# Patient Record
Sex: Female | Born: 1990 | Race: White | Hispanic: No | Marital: Single | Smoking: Never smoker
Health system: Southern US, Community
[De-identification: ages and names within clinical notes are randomized; demographics above are authoritative.]

## PROBLEM LIST (undated history)

## (undated) DIAGNOSIS — R569 Unspecified convulsions: Secondary | ICD-10-CM

## (undated) DIAGNOSIS — F419 Anxiety disorder, unspecified: Secondary | ICD-10-CM

## (undated) DIAGNOSIS — F32A Depression, unspecified: Secondary | ICD-10-CM

## (undated) DIAGNOSIS — R51 Headache: Secondary | ICD-10-CM

## (undated) DIAGNOSIS — G40909 Epilepsy, unspecified, not intractable, without status epilepticus: Secondary | ICD-10-CM

## (undated) DIAGNOSIS — G43909 Migraine, unspecified, not intractable, without status migrainosus: Secondary | ICD-10-CM

## (undated) DIAGNOSIS — K589 Irritable bowel syndrome without diarrhea: Secondary | ICD-10-CM

## (undated) DIAGNOSIS — F329 Major depressive disorder, single episode, unspecified: Secondary | ICD-10-CM

## (undated) HISTORY — DX: Headache: R51

## (undated) HISTORY — DX: Epilepsy, unspecified, not intractable, without status epilepticus: G40.909

## (undated) HISTORY — PX: OTHER SURGICAL HISTORY: SHX169

## (undated) HISTORY — DX: Migraine, unspecified, not intractable, without status migrainosus: G43.909

## (undated) HISTORY — DX: Unspecified convulsions: R56.9

## (undated) HISTORY — DX: Depression, unspecified: F32.A

## (undated) HISTORY — DX: Major depressive disorder, single episode, unspecified: F32.9

## (undated) HISTORY — PX: TONSILLECTOMY: SUR1361

## (undated) HISTORY — DX: Anxiety disorder, unspecified: F41.9

---
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
## 2012-02-11 ENCOUNTER — Encounter (HOSPITAL_COMMUNITY): Payer: Self-pay | Admitting: Psychiatry

## 2012-02-11 ENCOUNTER — Ambulatory Visit (INDEPENDENT_AMBULATORY_CARE_PROVIDER_SITE_OTHER): Payer: BC Managed Care – PPO | Admitting: Psychiatry

## 2012-02-11 VITALS — BP 110/68 | Ht 62.0 in | Wt 151.6 lb

## 2012-02-11 DIAGNOSIS — F4001 Agoraphobia with panic disorder: Secondary | ICD-10-CM | POA: Insufficient documentation

## 2012-02-11 MED ORDER — FLUOXETINE HCL 10 MG PO CAPS: ORAL_CAPSULE | ORAL | Status: DC

## 2012-02-11 NOTE — Progress Notes (Signed)
[facility] [address] Progress Note  Diane Ford [id number] 21 y.o.  02/11/2012 10:04 AM  Chief Complaint: I have dropped out of school, I'm struggling with anxiety and I stay mostly to myself  History of Present Illness: Patient is a 21 year old diagnosed with panic disorder with agoraphobia, depressive disorder NOS who presents today for a followup visit. Patient was seen once in August of 2012 and has not been back since then. Mom adds to the primary care physician has been prescribing her Zoloft, she's been off the clonazepam for many months now but as patient continues to struggle with anxiety, they decided that they needed the patient's medications adjusted or changed.  Patient says that she's not going to school, stays mostly to her self, gets really anxious if she has to go in public as she is afraid she could have a seizure. She last had a seizure in January but still has a lot of anxiety about having them in public. She also complains of headaches on a daily basis as she worries about her future, refuses to learn how to drive as she states she will have a seizure and end up killing someone. Mom plans to get her enrolled in a [place]. They both deny any safety concerns, any side effects of the medication. The patient also denies self mutilating behaviors. She adds that she needs to restart seeing her therapist again as she wants her anxiety under control, wants to be able to live a regular life.  Suicidal Ideation: No Plan Formed: No Patient has means to carry out plan: No  Homicidal Ideation: No Plan Formed: No Patient has means to carry out plan: No  Review of Systems: Psychiatric: Agitation: No Hallucination: No Depressed Mood: Yes Insomnia: No Hypersomnia: No Altered Concentration: Yes Feels Worthless: Yes Grandiose Ideas: No Belief In Special Powers: No New/Increased Substance Abuse: No Compulsions: No  Neurologic: Headache: Yes Seizure: Yes has a history of  epilepsy, last seizure was in January of 2013 as per patient Paresthesias: No  Past Medical Family, Social History: Has dropped out of high school  Outpatient Encounter Prescriptions as of 02/11/2012  Medication Sig Dispense Refill  . AZURETTE 0.15-0.02/0.01 MG (21/5) tablet       . FLUoxetine (PROZAC) 10 MG capsule Po 1 QAM for 10 days and then 2 QPM  60 capsule  1  . levETIRAcetam (KEPPRA) 500 MG tablet       . DISCONTD: clonazePAM (KLONOPIN) 0.5 MG tablet Take 0.25 mg by mouth once.        Marland Kitchen DISCONTD: sertraline (ZOLOFT) 50 MG tablet Take 50 mg by mouth daily.          Past Psychiatric History/Hospitalization(s): Anxiety: Yes Bipolar Disorder: No Depression: Yes Mania: No Psychosis: No Schizophrenia: No Personality Disorder: No Hospitalization for psychiatric illness: No History of Electroconvulsive Shock Therapy: No Prior Suicide Attempts: No  Physical Exam: Constitutional:  BP 110/68  Ht 5\' 2"  (1.575 m)  Wt 151 lb 9.6 oz (68.765 kg)  BMI 27.73 kg/m2  General Appearance: alert, oriented, no acute distress but appears sad and has constricted affect  Musculoskeletal: Strength & Muscle Tone: within normal limits Gait & Station: normal Patient leans: N/A  Psychiatric: Speech (describe rate, volume, coherence, spontaneity, and abnormalities if any): Normal in volume, rate, tone, spontaneous   Thought Process (describe rate, content, abstract reasoning, and computation): Organized, goal directed, age appropriate   Associations: Relevant and Intact  Thoughts: obsessions  Mental Status: Orientation: oriented to person,  place and situation Mood & Affect: depressed affect and anxiety Attention Span & Concentration: so,so  Medical Decision Making (Choose Three): Established Problem, Worsening (2), New Problem, with no additional work-up planned (3), Review of Last Therapy Session (1), Review of Medication Regimen & Side Effects (2), Review of New Medication or Change in  Dosage (2) and Review or Order of Psychological Test(s) (1)  Assessment: Axis I: Panic disorder with agoraphobia, depressive disorder NOS  Axis II: Deferred  Axis III: Status post tonsillectomy, migraines, generalized seizure disorder  Axis IV: Educational issues, social issues, health issues  Axis V: 55-60   Plan: Discontinue Zoloft 50 mg and to start Prozac 10 mg one in the morning for 10 days and then increase to 20 mg in the morning. This and benefits along with the side effects were discussed with mom and the patient and they were agreeable with this plan To restart seeing Peggy for therapy here at the office to help with anxiety and mood Discussed with mom the need for patient to file for disability Continue Keppra 500 mg twice daily for seizure disorder and to followup regularly with her neurologist in regards to his seizures and headaches Discussed regular exercise to help with anxiety in length at this visit including meditation Call when necessary Followup in 4 weeks  Nelly Rout, MD 02/11/2012

## 2012-02-25 ENCOUNTER — Ambulatory Visit (HOSPITAL_COMMUNITY): Payer: Self-pay | Admitting: Psychiatry

## 2012-03-01 ENCOUNTER — Emergency Department (HOSPITAL_COMMUNITY): Payer: BC Managed Care – PPO

## 2012-03-01 ENCOUNTER — Encounter (HOSPITAL_COMMUNITY): Payer: Self-pay | Admitting: *Deleted

## 2012-03-01 ENCOUNTER — Emergency Department (HOSPITAL_COMMUNITY)
Admission: EM | Admit: 2012-03-01 | Discharge: 2012-03-01 | Disposition: A | Discharge status: Home / Self Care | Payer: BC Managed Care – PPO | Attending: Emergency Medicine | Admitting: Emergency Medicine

## 2012-03-01 DIAGNOSIS — R51 Headache: Secondary | ICD-10-CM | POA: Insufficient documentation

## 2012-03-01 DIAGNOSIS — R10819 Abdominal tenderness, unspecified site: Secondary | ICD-10-CM | POA: Insufficient documentation

## 2012-03-01 DIAGNOSIS — M549 Dorsalgia, unspecified: Secondary | ICD-10-CM | POA: Insufficient documentation

## 2012-03-01 DIAGNOSIS — R109 Unspecified abdominal pain: Secondary | ICD-10-CM | POA: Insufficient documentation

## 2012-03-01 LAB — CBC
HCT: 38 % (ref 36.0–46.0)
Hemoglobin: 13.2 g/dL (ref 12.0–15.0)
MCHC: 34.7 g/dL (ref 30.0–36.0)
RBC: 4.52 MIL/uL (ref 3.87–5.11)

## 2012-03-01 LAB — DIFFERENTIAL
Basophils Relative: 0 % (ref 0–1)
Lymphs Abs: 2 10*3/uL (ref 0.7–4.0)
Monocytes Absolute: 0.4 10*3/uL (ref 0.1–1.0)
Monocytes Relative: 5 % (ref 3–12)
Neutro Abs: 5.1 10*3/uL (ref 1.7–7.7)
Neutrophils Relative %: 67 % (ref 43–77)

## 2012-03-01 LAB — COMPREHENSIVE METABOLIC PANEL
Albumin: 3.6 g/dL (ref 3.5–5.2)
Alkaline Phosphatase: 66 U/L (ref 39–117)
BUN: 9 mg/dL (ref 6–23)
CO2: 24 mEq/L (ref 19–32)
Chloride: 101 mEq/L (ref 96–112)
Creatinine, Ser: 0.71 mg/dL (ref 0.50–1.10)
GFR calc Af Amer: >90 mL/min (ref >90)
GFR calc non Af Amer: >90 mL/min (ref >90)
Glucose, Bld: 81 mg/dL (ref 70–99)
Potassium: 3.5 mEq/L (ref 3.5–5.1)
Total Bilirubin: 0.4 mg/dL (ref 0.3–1.2)

## 2012-03-01 LAB — URINALYSIS, ROUTINE W REFLEX MICROSCOPIC
Bilirubin Urine: NEGATIVE
Hgb urine dipstick: NEGATIVE
Ketones, ur: NEGATIVE mg/dL
Nitrite: NEGATIVE
Specific Gravity, Urine: 1.02 (ref 1.005–1.030)
Urobilinogen, UA: 0.2 mg/dL (ref 0.0–1.0)

## 2012-03-01 LAB — PREGNANCY, URINE: Preg Test, Ur: NEGATIVE

## 2012-03-01 LAB — LIPASE, BLOOD: Lipase: 46 U/L (ref 11–59)

## 2012-03-01 MED ORDER — SODIUM CHLORIDE 0.9 % IV BOLUS (SEPSIS)
250.0000 mL | Freq: Once | INTRAVENOUS | Status: AC
  Administered 2012-03-01: 250 mL via INTRAVENOUS

## 2012-03-01 MED ORDER — HYDROMORPHONE HCL PF 1 MG/ML IJ SOLN
1.0000 mg | Freq: Once | INTRAMUSCULAR | Status: AC
  Administered 2012-03-01: 1 mg via INTRAVENOUS
  Filled 2012-03-01: qty 1

## 2012-03-01 MED ORDER — IOHEXOL 300 MG/ML  SOLN
100.0000 mL | Freq: Once | INTRAMUSCULAR | Status: AC | PRN
  Administered 2012-03-01: 100 mL via INTRAVENOUS

## 2012-03-01 MED ORDER — SODIUM CHLORIDE 0.9 % IV SOLN
INTRAVENOUS | Status: DC
  Administered 2012-03-01: 19:00:00 via INTRAVENOUS

## 2012-03-01 MED ORDER — HYDROCODONE-ACETAMINOPHEN 5-325 MG PO TABS: 1.0000 | ORAL_TABLET | Freq: Four times a day (QID) | ORAL | Status: AC | PRN

## 2012-03-01 MED ORDER — ONDANSETRON HCL 4 MG/2ML IJ SOLN
4.0000 mg | Freq: Once | INTRAMUSCULAR | Status: AC
  Administered 2012-03-01: 4 mg via INTRAVENOUS
  Filled 2012-03-01: qty 2

## 2012-03-01 NOTE — ED Provider Notes (Addendum)
History     CSN: [id number]  Arrival date & time 03/01/12  1656   First MD Initiated Contact with Patient 03/01/12 1817      Chief Complaint  Patient presents with  . Abdominal Pain    (Consider location/radiation/quality/duration/timing/severity/associated sxs/prior treatment) Patient is a 21 y.o. female presenting with abdominal pain. The history is provided by the patient.  Abdominal Pain The primary symptoms of the illness include abdominal pain. The primary symptoms of the illness do not include fever, shortness of breath, nausea, vomiting, diarrhea, hematochezia, dysuria or vaginal discharge. The current episode started more than 2 days ago. The onset of the illness was gradual. The problem has been rapidly worsening.  The patient states that she believes she is currently not pregnant. The patient has not had a change in bowel habit. Additional symptoms associated with the illness include anorexia and back pain. Symptoms associated with the illness do not include chills, diaphoresis, constipation, hematuria or frequency.    Past Medical History  Diagnosis Date  . Seizures   . Headache     Past Surgical History  Procedure Date  . Tonsillectomy     Family History  Problem Relation Age of Onset  . OCD Father     History  Substance Use Topics  . Smoking status: Never Smoker   . Smokeless tobacco: Not on file  . Alcohol Use: No    OB History    Grav Para Term Preterm Abortions TAB SAB Ect Mult Living                  Review of Systems  Constitutional: Negative for fever, chills and diaphoresis.  HENT: Negative for congestion and neck pain.   Eyes: Negative for visual disturbance.  Respiratory: Negative for cough and shortness of breath.   Gastrointestinal: Positive for abdominal pain and anorexia. Negative for nausea, vomiting, diarrhea, constipation and hematochezia.  Genitourinary: Negative for dysuria, frequency, hematuria and vaginal discharge.    Musculoskeletal: Positive for back pain.  Skin: Negative for rash.  Neurological: Positive for headaches.  Hematological: Does not bruise/bleed easily.    Allergies  Review of patient's allergies indicates no known allergies.  Home Medications   Current Outpatient Rx  Name Route Sig Dispense Refill  . AZURETTE 0.15-0.02/0.01 MG (21/5) PO TABS Oral Take 1 tablet by mouth daily.     Marland Kitchen FLUOXETINE HCL 10 MG PO CAPS Oral Take 20 mg by mouth daily.    . IBUPROFEN 200 MG PO TABS Oral Take 800 mg by mouth as needed. For headache pain    . LEVETIRACETAM 500 MG PO TABS Oral Take 500 mg by mouth 2 (two) times daily.     Marland Kitchen HYDROCODONE-ACETAMINOPHEN 5-325 MG PO TABS Oral Take 1-2 tablets by mouth every 6 (six) hours as needed for pain. 10 tablet 0    BP 118/81  Pulse 83  Temp 98.1 F (36.7 C) (Oral)  Resp 18  Ht 5\' 2"  (1.575 m)  Wt 150 lb (68.04 kg)  BMI 27.44 kg/m2  SpO2 100%  LMP 02/08/2012  Physical Exam  Nursing note and vitals reviewed. Constitutional: She is oriented to person, place, and time. She appears well-developed and well-nourished. No distress.  HENT:  Head: Normocephalic and atraumatic.  Mouth/Throat: Oropharynx is clear and moist.  Eyes: Conjunctivae and EOM are normal. Pupils are equal, round, and reactive to light.  Neck: Normal range of motion. Neck supple.  Cardiovascular: Normal rate, regular rhythm and normal heart sounds.  No murmur heard. Pulmonary/Chest: Effort normal and breath sounds normal.  Abdominal: Soft. Bowel sounds are normal. There is tenderness.       Tender right-sided abdomen right lower quadrant greater than right upper quadrant.  Musculoskeletal: Normal range of motion. She exhibits no edema and no tenderness.  Neurological: She is alert and oriented to person, place, and time. No cranial nerve deficit. She exhibits normal muscle tone. Coordination normal.  Skin: Skin is warm. No rash noted.    ED Course  Procedures (including critical  care time)   Labs Reviewed  URINALYSIS, ROUTINE W REFLEX MICROSCOPIC  PREGNANCY, URINE  CBC  DIFFERENTIAL  COMPREHENSIVE METABOLIC PANEL  LIPASE, BLOOD   Ct Abdomen Pelvis W Contrast  03/01/2012  *RADIOLOGY REPORT*  Clinical Data: Left-sided abdominal pain.  CT ABDOMEN AND PELVIS WITH CONTRAST  Technique:  Multidetector CT imaging of the abdomen and pelvis was performed following the standard protocol during bolus administration of intravenous contrast.  Contrast: OMNIPAQUE IOHEXOL 300 MG/ML  SOLN  Comparison: 06/26/2010  Findings: Stable linear scarring in the right middle lobe.  Lung bases otherwise clear.  No pleural effusions.  Liver, gallbladder, spleen, pancreas, adrenals and kidneys are unremarkable.  Uterus, adnexa and urinary bladder unremarkable.  Appendix is visualized and is normal.  There are mildly prominent right lower quadrant and central mesenteric lymph nodes, stable since prior study.  Large and small bowel are unremarkable.  No free fluid, free air or adenopathy. No acute bony abnormality.  IMPRESSION: Normal appendix.  No acute findings.  Original Report Authenticated By: Cyndie Chime, M.D.   Results for orders placed during the hospital encounter of 03/01/12  URINALYSIS, ROUTINE W REFLEX MICROSCOPIC      Component Value Range   Color, Urine YELLOW  YELLOW   APPearance CLEAR  CLEAR   Specific Gravity, Urine 1.020  1.005 - 1.030   pH 6.5  5.0 - 8.0   Glucose, UA NEGATIVE  NEGATIVE mg/dL   Hgb urine dipstick NEGATIVE  NEGATIVE   Bilirubin Urine NEGATIVE  NEGATIVE   Ketones, ur NEGATIVE  NEGATIVE mg/dL   Protein, ur NEGATIVE  NEGATIVE mg/dL   Urobilinogen, UA 0.2  0.0 - 1.0 mg/dL   Nitrite NEGATIVE  NEGATIVE   Leukocytes, UA NEGATIVE  NEGATIVE  PREGNANCY, URINE      Component Value Range   Preg Test, Ur NEGATIVE  NEGATIVE  CBC      Component Value Range   WBC 7.6  4.0 - 10.5 K/uL   RBC 4.52  3.87 - 5.11 MIL/uL   Hemoglobin 13.2  12.0 - 15.0 g/dL   HCT  69.6  29.5 - 28.4 %   MCV 84.1  78.0 - 100.0 fL   MCH 29.2  26.0 - 34.0 pg   MCHC 34.7  30.0 - 36.0 g/dL   RDW 13.2  44.0 - 10.2 %   Platelets 284  150 - 400 K/uL  DIFFERENTIAL      Component Value Range   Neutrophils Relative 67  43 - 77 %   Neutro Abs 5.1  1.7 - 7.7 K/uL   Lymphocytes Relative 27  12 - 46 %   Lymphs Abs 2.0  0.7 - 4.0 K/uL   Monocytes Relative 5  3 - 12 %   Monocytes Absolute 0.4  0.1 - 1.0 K/uL   Eosinophils Relative 1  0 - 5 %   Eosinophils Absolute 0.1  0.0 - 0.7 K/uL   Basophils Relative 0  0 -  1 %   Basophils Absolute 0.0  0.0 - 0.1 K/uL  COMPREHENSIVE METABOLIC PANEL      Component Value Range   Sodium 136  135 - 145 mEq/L   Potassium 3.5  3.5 - 5.1 mEq/L   Chloride 101  96 - 112 mEq/L   CO2 24  19 - 32 mEq/L   Glucose, Bld 81  70 - 99 mg/dL   BUN 9  6 - 23 mg/dL   Creatinine, Ser 4.54  0.50 - 1.10 mg/dL   Calcium 9.5  8.4 - 09.8 mg/dL   Total Protein 7.1  6.0 - 8.3 g/dL   Albumin 3.6  3.5 - 5.2 g/dL   AST 13  0 - 37 U/L   ALT 8  0 - 35 U/L   Alkaline Phosphatase 66  39 - 117 U/L   Total Bilirubin 0.4  0.3 - 1.2 mg/dL   GFR calc non Af Amer >90  >90 mL/min   GFR calc Af Amer >90  >90 mL/min  LIPASE, BLOOD      Component Value Range   Lipase 46  11 - 59 U/L     1. Abdominal pain       MDM   Patient with right-sided abdominal pain for one to 2 weeks but worse in the past day. Similar symptoms a few years ago workup without any specific findings. Patient states most of the pain is right lower corner and some his right upper quadrant past surgical history his only sniffing for tonsillectomy. Patient's labs are normal including her liver function tests and lipase urinalysis is normal and pregnancy test is negative patient will be transferred via [facility] for CT scan since ours is down and then will return with the results.   Patient CT scan without any sniffing abnormalities labs without any significant abnormalities  to include pregnancy test being negative urinalysis being negative no leukocytosis LFTs are normal lipase is normal we'll discharge patient home with pain medicine and followup with her doctor in [place].     Shelda Jakes, MD 03/01/12 2011  Shelda Jakes, MD 03/01/12 2135

## 2012-03-01 NOTE — Discharge Instructions (Signed)
CT scan lab workup is negative for any significant problems in the abdomen followup with your regular Dr. In the next few days take pain medicine as directed. Return for new or worse symptoms.

## 2012-03-01 NOTE — ED Notes (Signed)
Pain rt side of body , headache, abd and back pain.Marland Kitchen  No NVD.

## 2012-03-10 ENCOUNTER — Ambulatory Visit (HOSPITAL_COMMUNITY): Payer: Self-pay | Admitting: Psychiatry

## 2012-03-11 ENCOUNTER — Ambulatory Visit (INDEPENDENT_AMBULATORY_CARE_PROVIDER_SITE_OTHER): Payer: BC Managed Care – PPO | Admitting: Internal Medicine

## 2012-03-11 ENCOUNTER — Encounter (INDEPENDENT_AMBULATORY_CARE_PROVIDER_SITE_OTHER): Payer: Self-pay | Admitting: Internal Medicine

## 2012-03-11 VITALS — BP 106/72 | HR 80 | Temp 98.4°F | Ht 62.0 in | Wt 149.2 lb

## 2012-03-11 DIAGNOSIS — R52 Pain, unspecified: Secondary | ICD-10-CM

## 2012-03-11 DIAGNOSIS — G40909 Epilepsy, unspecified, not intractable, without status epilepticus: Secondary | ICD-10-CM

## 2012-03-11 DIAGNOSIS — K589 Irritable bowel syndrome without diarrhea: Secondary | ICD-10-CM

## 2012-03-11 DIAGNOSIS — G43909 Migraine, unspecified, not intractable, without status migrainosus: Secondary | ICD-10-CM

## 2012-03-11 DIAGNOSIS — R1033 Periumbilical pain: Secondary | ICD-10-CM

## 2012-03-11 MED ORDER — HYOSCYAMINE SULFATE 0.125 MG SL SUBL: 0.1250 mg | SUBLINGUAL_TABLET | SUBLINGUAL | Status: DC | PRN

## 2012-03-11 NOTE — Patient Instructions (Addendum)
Levsin every 4 hrs as needed. CRP today. PR in 2 weeks.

## 2012-03-11 NOTE — Progress Notes (Addendum)
Subjective:     Patient ID: Diane Ford, female   DOB: 1990-12-13, 21 y.o.   MRN: [id number]  HPI Seen in the ED 03/01/2012 for mid abdominal pain radiating into sides. She says she is still having the pain. Her mother says she has had this pain for years. She says she has the pain after she eats. She has not tried any medication for this. She occasionally has acid reflux.  She says even with light touch she has pain. Appetite is okay. No weight loss. BM x 1 a day then she have one every 3 days.  She sometimes will get diarrhea with the anxiety.   She says she had rectal bleeding 3 days ago. Noticed in the toilet.  03/01/2012 CT abdomen/pelvis with CM;IMPRESSION:  Normal appendix.  No acute findings.    CBC    Component Value Date/Time   WBC 7.6 03/01/2012 1920   RBC 4.52 03/01/2012 1920   HGB 13.2 03/01/2012 1920   HCT 38.0 03/01/2012 1920   PLT 284 03/01/2012 1920   MCV 84.1 03/01/2012 1920   MCH 29.2 03/01/2012 1920   MCHC 34.7 03/01/2012 1920   RDW 12.7 03/01/2012 1920   LYMPHSABS 2.0 03/01/2012 1920   MONOABS 0.4 03/01/2012 1920   EOSABS 0.1 03/01/2012 1920   BASOSABS 0.0 03/01/2012 1920    BMET    Component Value Date/Time   NA 136 03/01/2012 1920   K 3.5 03/01/2012 1920   CL 101 03/01/2012 1920   CO2 24 03/01/2012 1920   GLUCOSE 81 03/01/2012 1920   BUN 9 03/01/2012 1920   CREATININE 0.71 03/01/2012 1920   CALCIUM 9.5 03/01/2012 1920   GFRNONAA >90 03/01/2012 1920   GFRAA >90 03/01/2012 1920    Urinalysis    Component Value Date/Time   COLORURINE YELLOW 03/01/2012 1805   APPEARANCEUR CLEAR 03/01/2012 1805   LABSPEC 1.020 03/01/2012 1805   PHURINE 6.5 03/01/2012 1805   GLUCOSEU NEGATIVE 03/01/2012 1805   HGBUR NEGATIVE 03/01/2012 1805   BILIRUBINUR NEGATIVE 03/01/2012 1805   KETONESUR NEGATIVE 03/01/2012 1805   PROTEINUR NEGATIVE 03/01/2012 1805   UROBILINOGEN 0.2 03/01/2012 1805   NITRITE NEGATIVE 03/01/2012 1805   LEUKOCYTESUR NEGATIVE 03/01/2012 1805   Drugs of Abuse  No  results found for this basename: labopia, cocainscrnur, labbenz, amphetmu, thcu, labbarb       Review of Systems see hpi Current Outpatient Prescriptions  Medication Sig Dispense Refill  . aspirin-acetaminophen-caffeine (EXCEDRIN MIGRAINE) 250-250-65 MG per tablet Take 1 tablet by mouth every 6 (six) hours as needed.      Marland Kitchen AZURETTE 0.15-0.02/0.01 MG (21/5) tablet Take 1 tablet by mouth daily.       Marland Kitchen FLUoxetine (PROZAC) 10 MG capsule Take 20 mg by mouth daily.      Marland Kitchen HYDROcodone-acetaminophen (NORCO) 5-325 MG per tablet Take 1-2 tablets by mouth every 6 (six) hours as needed for pain.  10 tablet  0  . ibuprofen (ADVIL,MOTRIN) 200 MG tablet Take 800 mg by mouth as needed. For headache pain      . levETIRAcetam (KEPPRA) 500 MG tablet Take 500 mg by mouth 2 (two) times daily.        Past Medical History  Diagnosis Date  . Seizures   . Headache   . Epilepsy   . Anxiety   . Migraines    Past Surgical History  Procedure Date  . Tonsillectomy   . Temperol lobe brain damage   . Tonsillectomy    History  Social History  . Marital Status: Single    Spouse Name: N/A    Number of Children: N/A  . Years of Education: N/A   Occupational History  . Not on file.   Social History Main Topics  . Smoking status: Never Smoker   . Smokeless tobacco: Not on file  . Alcohol Use: No  . Drug Use: No  . Sexually Active: Yes    Birth Control/ Protection: Pill   Other Topics Concern  . Not on file   Social History Narrative  . No narrative on file   Family Status  Relation Status Death Age  . Father Alive     hypertension,DM2, sleep apnea  . Mother Alive     good health  . Brother Alive     good health   No Known Allergies      Objective:   Physical Exam Filed Vitals:   03/11/12 1506  Height: 5\' 2"  (1.575 m)  Weight: 149 lb 3.2 oz (67.677 kg)   Alert and oriented. Skin warm and dry. Oral mucosa is moist.   . Sclera anicteric, conjunctivae is pink. Thyroid not enlarged.  No cervical lymphadenopathy. Lungs clear. Heart regular rate and rhythm.  Abdomen is soft. Bowel sounds are positive. No hepatomegaly. No abdominal masses felt. Tenderness periumbilical area. Stools guaiac negative No edema to lower extremities  Multiple tatoos.    Assessment:    Possible IBS,.Doubt this is Crohn's.    Plan:    Will get a CRP and e-prescribed Levsin 0.125mg  sl every 4 hrs a needed to her drug store. Two stool cards sent home with patient.

## 2012-03-12 LAB — C-REACTIVE PROTEIN: CRP: 0.66 mg/dL — ABNORMAL HIGH (ref <0.60)

## 2012-03-22 ENCOUNTER — Other Ambulatory Visit (HOSPITAL_COMMUNITY): Payer: Self-pay | Admitting: Psychiatry

## 2012-03-22 ENCOUNTER — Other Ambulatory Visit (HOSPITAL_COMMUNITY): Payer: Self-pay | Admitting: *Deleted

## 2012-03-22 ENCOUNTER — Telehealth (INDEPENDENT_AMBULATORY_CARE_PROVIDER_SITE_OTHER): Payer: Self-pay | Admitting: Internal Medicine

## 2012-03-22 ENCOUNTER — Telehealth (HOSPITAL_COMMUNITY): Payer: Self-pay | Admitting: *Deleted

## 2012-03-22 DIAGNOSIS — R109 Unspecified abdominal pain: Secondary | ICD-10-CM

## 2012-03-22 MED ORDER — OMEPRAZOLE 40 MG PO CPDR: 40.0000 mg | DELAYED_RELEASE_CAPSULE | Freq: Every day | ORAL | Status: DC

## 2012-03-22 MED ORDER — FLUOXETINE HCL 10 MG PO CAPS: 20.0000 mg | ORAL_CAPSULE | Freq: Every day | ORAL | Status: DC

## 2012-03-22 NOTE — Telephone Encounter (Signed)
Results of stool hemocult given to mother. Mother states she is applying for disability for her daughter due to her seizures. Rx for omeprazole e-prescribed to pharmacy. OV in 2 weeks with me and I will discuss with Dr. Karilyn Cota.

## 2012-03-22 NOTE — Telephone Encounter (Signed)
Error

## 2012-03-22 NOTE — Addendum Note (Signed)
Addended by: Tonny Bollman on: 03/22/2012 10:22 AM   Modules accepted: Orders

## 2012-04-07 ENCOUNTER — Ambulatory Visit (HOSPITAL_COMMUNITY): Payer: Self-pay | Admitting: Psychiatry

## 2012-04-08 ENCOUNTER — Encounter (INDEPENDENT_AMBULATORY_CARE_PROVIDER_SITE_OTHER): Payer: Self-pay

## 2012-06-03 ENCOUNTER — Ambulatory Visit (INDEPENDENT_AMBULATORY_CARE_PROVIDER_SITE_OTHER): Payer: Self-pay | Admitting: Internal Medicine

## 2012-06-23 ENCOUNTER — Ambulatory Visit (HOSPITAL_COMMUNITY): Payer: Self-pay | Admitting: Psychiatry

## 2012-11-04 ENCOUNTER — Ambulatory Visit (INDEPENDENT_AMBULATORY_CARE_PROVIDER_SITE_OTHER): Payer: Self-pay | Admitting: Internal Medicine

## 2012-11-11 ENCOUNTER — Encounter (INDEPENDENT_AMBULATORY_CARE_PROVIDER_SITE_OTHER): Payer: Self-pay | Admitting: Internal Medicine

## 2012-11-11 ENCOUNTER — Ambulatory Visit (INDEPENDENT_AMBULATORY_CARE_PROVIDER_SITE_OTHER): Payer: BC Managed Care – PPO | Admitting: Internal Medicine

## 2012-11-11 VITALS — BP 106/62 | HR 76 | Temp 98.0°F | Ht 62.5 in | Wt 143.0 lb

## 2012-11-11 DIAGNOSIS — R110 Nausea: Secondary | ICD-10-CM

## 2012-11-11 MED ORDER — PANTOPRAZOLE SODIUM 40 MG PO TBEC: 40.0000 mg | DELAYED_RELEASE_TABLET | Freq: Every day | ORAL | Status: DC

## 2012-11-11 NOTE — Patient Instructions (Addendum)
HIDA scan.  Rx for Protonix to her drug strong.

## 2012-11-11 NOTE — Progress Notes (Signed)
Subjective:     Patient ID: Diane Ford, female   DOB: 1991/05/24, 22 y.o.   MRN: [id number]  HPIShe presents today with c/o of epigastric pain. She was last seen about 8 months ago for same. She has frequent nausea. She tells me she has epigastric pain before and after she eats.  She tells me she sometimes goes days without eating.  She has some burning around her umblicus at times.  She has a BM 1-4 times a day. She tells me if she becomes upset, she will have loose stools. Hx of IBS. She has lost about 6 pounds since her last night. She denies any melena or bright red rectal bleeding. Hx of depression. She is seen at [facility] here in [place].   03/01/2012 CT abdomen/pelvis with CM;IMPRESSION:  Normal appendix.  No acute findings.   Last period end of last month. Sexually active. Boyfriend uses a condon.  CBC    Component Value Date/Time   WBC 7.6 03/01/2012 1920   RBC 4.52 03/01/2012 1920   HGB 13.2 03/01/2012 1920   HCT 38.0 03/01/2012 1920   PLT 284 03/01/2012 1920   MCV 84.1 03/01/2012 1920   MCH 29.2 03/01/2012 1920   MCHC 34.7 03/01/2012 1920   RDW 12.7 03/01/2012 1920   LYMPHSABS 2.0 03/01/2012 1920   MONOABS 0.4 03/01/2012 1920   EOSABS 0.1 03/01/2012 1920   BASOSABS 0.0 03/01/2012 1920   CMP     Component Value Date/Time   NA 136 03/01/2012 1920   K 3.5 03/01/2012 1920   CL 101 03/01/2012 1920   CO2 24 03/01/2012 1920   GLUCOSE 81 03/01/2012 1920   BUN 9 03/01/2012 1920   CREATININE 0.71 03/01/2012 1920   CALCIUM 9.5 03/01/2012 1920   PROT 7.1 03/01/2012 1920   ALBUMIN 3.6 03/01/2012 1920   AST 13 03/01/2012 1920   ALT 8 03/01/2012 1920   ALKPHOS 66 03/01/2012 1920   BILITOT 0.4 03/01/2012 1920   GFRNONAA >90 03/01/2012 1920   GFRAA >90 03/01/2012 1920       Review of Systems see hpi Current Outpatient Prescriptions  Medication Sig Dispense Refill  . aspirin-acetaminophen-caffeine (EXCEDRIN MIGRAINE) 250-250-65 MG per tablet Take 1 tablet by mouth every 6  (six) hours as needed.      Marland Kitchen ibuprofen (ADVIL,MOTRIN) 200 MG tablet Take 800 mg by mouth as needed. For headache pain      . levETIRAcetam (KEPPRA) 500 MG tablet Take 500 mg by mouth 2 (two) times daily.       . hyoscyamine (LEVSIN SL) 0.125 MG SL tablet Place 0.125 mg under the tongue every 4 (four) hours as needed for cramping.      . pantoprazole (PROTONIX) 40 MG tablet Take 1 tablet (40 mg total) by mouth daily.  30 tablet  3   No current facility-administered medications for this visit.   Past Medical History  Diagnosis Date  . Seizures   . Headache   . Epilepsy   . Anxiety   . Migraines   . Depression    Past Surgical History  Procedure Laterality Date  . Tonsillectomy    . Temperol lobe brain damage    . Tonsillectomy     No Known Allergies      Objective:   Physical Exam  Filed Vitals:   11/11/12 1059  BP: 106/62  Pulse: 76  Temp: 98 F (36.7 C)  Height: 5' 2.5" (1.588 m)  Weight: 143 lb (64.864 kg)  Alert and oriented. Skin warm and dry. Oral mucosa is moist.   . Sclera anicteric, conjunctivae is pink. Thyroid not enlarged. No cervical lymphadenopathy. Lungs clear. Heart regular rate and rhythm.  Abdomen is soft. Bowel sounds are positive. No hepatomegaly. No abdominal masses felt. No tenderness.  No edema to lower extremities.  .     Assessment:    Nausea. ? GB disease. Possible PUD . I think a lot of her symptoms are related to her depression.  Plan:    HIDA scan, Cmet. H. Pylori. Rx for Protonix eprescribed to her pharmacy.

## 2012-11-12 ENCOUNTER — Telehealth (INDEPENDENT_AMBULATORY_CARE_PROVIDER_SITE_OTHER): Payer: Self-pay | Admitting: Internal Medicine

## 2012-11-12 LAB — COMPREHENSIVE METABOLIC PANEL
ALT: 17 U/L (ref 0–35)
AST: 17 U/L (ref 0–37)
Alkaline Phosphatase: 71 U/L (ref 39–117)
BUN: 14 mg/dL (ref 6–23)
Calcium: 9.6 mg/dL (ref 8.4–10.5)
Chloride: 104 mEq/L (ref 96–112)
Creat: 0.71 mg/dL (ref 0.50–1.10)
Total Bilirubin: 0.5 mg/dL (ref 0.3–1.2)

## 2012-11-12 NOTE — Telephone Encounter (Signed)
Results given to mother. All numbers were normal. Will be scheduled for a HIDA scan.

## 2012-11-16 ENCOUNTER — Encounter (HOSPITAL_COMMUNITY): Payer: BC Managed Care – PPO

## 2012-11-19 ENCOUNTER — Encounter (HOSPITAL_COMMUNITY): Payer: BC Managed Care – PPO

## 2012-12-02 ENCOUNTER — Telehealth (INDEPENDENT_AMBULATORY_CARE_PROVIDER_SITE_OTHER): Payer: Self-pay | Admitting: Internal Medicine

## 2012-12-02 ENCOUNTER — Encounter (HOSPITAL_COMMUNITY)
Admission: RE | Admit: 2012-12-02 | Discharge: 2012-12-02 | Disposition: A | Discharge status: Home / Self Care | Payer: BC Managed Care – PPO | Source: Ambulatory Visit | Attending: Internal Medicine | Admitting: Internal Medicine

## 2012-12-02 ENCOUNTER — Encounter (HOSPITAL_COMMUNITY): Payer: Self-pay

## 2012-12-02 DIAGNOSIS — R110 Nausea: Secondary | ICD-10-CM | POA: Insufficient documentation

## 2012-12-02 MED ORDER — TECHNETIUM TC 99M MEBROFENIN IV KIT
5.0000 | PACK | Freq: Once | INTRAVENOUS | Status: AC | PRN
Start: 2012-12-02 — End: 2012-12-02
  Administered 2012-12-02: 5 via INTRAVENOUS

## 2012-12-02 NOTE — Telephone Encounter (Signed)
No answer at home #

## 2012-12-02 NOTE — Telephone Encounter (Signed)
Opened in error

## 2012-12-03 ENCOUNTER — Other Ambulatory Visit (INDEPENDENT_AMBULATORY_CARE_PROVIDER_SITE_OTHER): Payer: Self-pay | Admitting: *Deleted

## 2012-12-03 ENCOUNTER — Encounter (INDEPENDENT_AMBULATORY_CARE_PROVIDER_SITE_OTHER): Payer: Self-pay | Admitting: *Deleted

## 2012-12-03 DIAGNOSIS — R634 Abnormal weight loss: Secondary | ICD-10-CM

## 2012-12-03 DIAGNOSIS — R110 Nausea: Secondary | ICD-10-CM

## 2012-12-09 ENCOUNTER — Ambulatory Visit (INDEPENDENT_AMBULATORY_CARE_PROVIDER_SITE_OTHER): Payer: Self-pay | Admitting: Internal Medicine

## 2012-12-16 ENCOUNTER — Ambulatory Visit (HOSPITAL_COMMUNITY)
Admission: RE | Admit: 2012-12-16 | Payer: BC Managed Care – PPO | Source: Ambulatory Visit | Admitting: Internal Medicine

## 2012-12-16 SURGERY — EGD (ESOPHAGOGASTRODUODENOSCOPY)
Anesthesia: Moderate Sedation

## 2013-02-08 ENCOUNTER — Emergency Department (HOSPITAL_COMMUNITY)
Admission: EM | Admit: 2013-02-08 | Discharge: 2013-02-09 | Disposition: A | Discharge status: Home / Self Care | Payer: BC Managed Care – PPO | Attending: Emergency Medicine | Admitting: Emergency Medicine

## 2013-02-08 ENCOUNTER — Encounter (HOSPITAL_COMMUNITY): Payer: Self-pay | Admitting: Emergency Medicine

## 2013-02-08 DIAGNOSIS — R1032 Left lower quadrant pain: Secondary | ICD-10-CM

## 2013-02-08 DIAGNOSIS — Z79899 Other long term (current) drug therapy: Secondary | ICD-10-CM | POA: Insufficient documentation

## 2013-02-08 DIAGNOSIS — R102 Pelvic and perineal pain: Secondary | ICD-10-CM

## 2013-02-08 DIAGNOSIS — R110 Nausea: Secondary | ICD-10-CM | POA: Insufficient documentation

## 2013-02-08 DIAGNOSIS — F329 Major depressive disorder, single episode, unspecified: Secondary | ICD-10-CM | POA: Insufficient documentation

## 2013-02-08 DIAGNOSIS — N949 Unspecified condition associated with female genital organs and menstrual cycle: Secondary | ICD-10-CM | POA: Insufficient documentation

## 2013-02-08 DIAGNOSIS — G40909 Epilepsy, unspecified, not intractable, without status epilepticus: Secondary | ICD-10-CM | POA: Insufficient documentation

## 2013-02-08 DIAGNOSIS — F411 Generalized anxiety disorder: Secondary | ICD-10-CM | POA: Insufficient documentation

## 2013-02-08 DIAGNOSIS — Z8719 Personal history of other diseases of the digestive system: Secondary | ICD-10-CM | POA: Insufficient documentation

## 2013-02-08 DIAGNOSIS — Z3202 Encounter for pregnancy test, result negative: Secondary | ICD-10-CM | POA: Insufficient documentation

## 2013-02-08 DIAGNOSIS — F3289 Other specified depressive episodes: Secondary | ICD-10-CM | POA: Insufficient documentation

## 2013-02-08 DIAGNOSIS — N898 Other specified noninflammatory disorders of vagina: Secondary | ICD-10-CM | POA: Insufficient documentation

## 2013-02-08 DIAGNOSIS — Z8679 Personal history of other diseases of the circulatory system: Secondary | ICD-10-CM | POA: Insufficient documentation

## 2013-02-08 HISTORY — DX: Irritable bowel syndrome, unspecified: K58.9

## 2013-02-08 LAB — COMPREHENSIVE METABOLIC PANEL
ALT: 9 U/L (ref 0–35)
AST: 12 U/L (ref 0–37)
Albumin: 3.8 g/dL (ref 3.5–5.2)
Calcium: 8.8 mg/dL (ref 8.4–10.5)
GFR calc Af Amer: >90 mL/min (ref >90)
Sodium: 138 mEq/L (ref 135–145)
Total Protein: 6.5 g/dL (ref 6.0–8.3)

## 2013-02-08 LAB — WET PREP, GENITAL
Clue Cells Wet Prep HPF POC: NONE SEEN
Trich, Wet Prep: NONE SEEN
WBC, Wet Prep HPF POC: NONE SEEN

## 2013-02-08 LAB — CBC WITH DIFFERENTIAL/PLATELET
Basophils Absolute: 0 10*3/uL (ref 0.0–0.1)
Basophils Relative: 0 % (ref 0–1)
Eosinophils Absolute: 0.1 10*3/uL (ref 0.0–0.7)
Eosinophils Relative: 1 % (ref 0–5)
MCH: 30.3 pg (ref 26.0–34.0)
MCV: 85.5 fL (ref 78.0–100.0)
Neutrophils Relative %: 70 % (ref 43–77)
Platelets: 209 10*3/uL (ref 150–400)
RDW: 13 % (ref 11.5–15.5)

## 2013-02-08 LAB — URINALYSIS, ROUTINE W REFLEX MICROSCOPIC
Specific Gravity, Urine: >1.03 — ABNORMAL HIGH (ref 1.005–1.030)
Urobilinogen, UA: 2 mg/dL — ABNORMAL HIGH (ref 0.0–1.0)
pH: 6 (ref 5.0–8.0)

## 2013-02-08 LAB — URINE MICROSCOPIC-ADD ON

## 2013-02-08 MED ORDER — OXYCODONE-ACETAMINOPHEN 5-325 MG PO TABS
1.0000 | ORAL_TABLET | Freq: Once | ORAL | Status: AC
  Administered 2013-02-08: 1 via ORAL
  Filled 2013-02-08: qty 1

## 2013-02-08 MED ORDER — SULFAMETHOXAZOLE-TRIMETHOPRIM 800-160 MG PO TABS: 1.0000 | ORAL_TABLET | Freq: Two times a day (BID) | ORAL | Status: DC

## 2013-02-08 MED ORDER — OXYCODONE-ACETAMINOPHEN 5-325 MG PO TABS: 1.0000 | ORAL_TABLET | Freq: Four times a day (QID) | ORAL | Status: DC | PRN

## 2013-02-08 NOTE — ED Notes (Signed)
Pt c/o abd pain and more so on the rt side than, but all over.

## 2013-02-08 NOTE — ED Notes (Signed)
Pt states she was having sex when the pain started.

## 2013-02-08 NOTE — ED Notes (Signed)
Pt c/o lower abdominal pain since Sunday. Pt also reports difficulty urinating. Pt states "I have not had the urge to go since the pain started and when I do pee there is pressure". Pt also reports nausea but denies vomiting or diarrhea. Pt's last BM was Friday but states "I have IBS so that is normal for me".

## 2013-02-08 NOTE — ED Provider Notes (Signed)
History    This chart was scribed for [employer]. Rubin Payor, MD by Quintella Reichert, ED scribe.  This patient was seen in room APA08/APA08 and the patient's care was started at 9:25 PM.   CSN: [id number]  Arrival date & time 02/08/13  1921      Chief Complaint  Patient presents with  . Abdominal Pain     The history is provided by the patient and a parent. No language interpreter was used.    HPI Comments: Diane Ford is a 22 y.o. female who presents to the Emergency Department complaining of sudden-onset, constant lower abdominal pain that began 2 days ago, with associated mild nausea.  Pt reports pain has been in the same area since it started and has not changed in severity.  She has h/o IBS but denies h/o similar symptoms.    She also notes that she observed some vaginal bleeding today outside of her normal menstrual period, and reports mild urine leakage.  Mother also notes that pt only urinated twice yesterday and urine was very dark.  Pt denies fever, emesis, vaginal discharge, diarrhea, or any other associated symptoms.  She states that she is sexually active but uses condoms.   Past Medical History  Diagnosis Date  . Seizures   . Headache(784.0)   . Epilepsy   . Anxiety   . Migraines   . Depression   . IBS (irritable bowel syndrome)     Past Surgical History  Procedure Laterality Date  . Tonsillectomy    . Temperol lobe brain damage    . Tonsillectomy      Family History  Problem Relation Age of Onset  . OCD Father     History  Substance Use Topics  . Smoking status: Never Smoker   . Smokeless tobacco: Not on file  . Alcohol Use: No    OB History   Grav Para Term Preterm Abortions TAB SAB Ect Mult Living                  Review of Systems  Constitutional: Negative for fever and chills.  HENT: Negative for sore throat and rhinorrhea.   Respiratory: Negative for cough and shortness of breath.   Cardiovascular: Negative for chest pain and leg  swelling.  Gastrointestinal: Positive for nausea and abdominal pain. Negative for vomiting and diarrhea.  Genitourinary: Positive for vaginal bleeding. Negative for dysuria, hematuria, vaginal discharge and difficulty urinating.  Musculoskeletal: Negative for back pain and arthralgias.  Skin: Negative for rash and wound.  Neurological: Negative for weakness and numbness.  Psychiatric/Behavioral: Negative for confusion and agitation.    Allergies  Review of patient's allergies indicates no known allergies.  Home Medications   Current Outpatient Rx  Name  Route  Sig  Dispense  Refill  . ibuprofen (ADVIL,MOTRIN) 200 MG tablet   Oral   Take 800 mg by mouth every 6 (six) hours as needed for pain, fever or headache. For headache pain         . levETIRAcetam (KEPPRA) 500 MG tablet   Oral   Take 500 mg by mouth 2 (two) times daily.          . peppermint spirit SPRT   Oral   Take 1 capsule by mouth daily.         Marland Kitchen oxyCODONE-acetaminophen (PERCOCET/ROXICET) 5-325 MG per tablet   Oral   Take 1-2 tablets by mouth every 6 (six) hours as needed for pain.   8 tablet  0   . sulfamethoxazole-trimethoprim (BACTRIM DS,SEPTRA DS) 800-160 MG per tablet   Oral   Take 1 tablet by mouth 2 (two) times daily.   6 tablet   0     BP 116/81  Pulse 90  Temp(Src) 97.9 F (36.6 C) (Oral)  Resp 18  Ht 5\' 1"  (1.549 m)  Wt 131 lb (59.421 kg)  BMI 24.76 kg/m2  SpO2 96%  LMP 01/13/2013  Physical Exam  Constitutional: She appears well-developed and well-nourished. No distress.  HENT:  Head: Normocephalic and atraumatic.  Eyes: Conjunctivae and EOM are normal.  Neck: Normal range of motion. Neck supple.  Cardiovascular: Normal rate, regular rhythm and normal heart sounds.   No murmur heard. Pulmonary/Chest: Effort normal. No respiratory distress. She has no wheezes. She has no rales.  Abdominal: Soft. There is tenderness (Periumbilical.). There is rebound (Some rebound tenderness).  There is no guarding.  No hernias palpated  Musculoskeletal:  NO CVA tenderness  Neurological: She is alert. Coordination normal.  Skin: Skin is warm and dry. No rash noted.  Psychiatric: She has a normal mood and affect. Her behavior is normal.  pelvic exam showed left-sided adnexal tenderness. Minimal bloody cervical discharge. No adnexal mass.  ED Course  Procedures (including critical care time)  9:28 PM-Discussed treatment plan which includes labs with pt at bedside and pt agreed to plan.    Results for orders placed during the hospital encounter of 02/08/13  WET PREP, GENITAL      Result Value Range   Yeast Wet Prep HPF POC NONE SEEN  NONE SEEN   Trich, Wet Prep NONE SEEN  NONE SEEN   Clue Cells Wet Prep HPF POC NONE SEEN  NONE SEEN   WBC, Wet Prep HPF POC NONE SEEN  NONE SEEN  URINALYSIS, ROUTINE W REFLEX MICROSCOPIC      Result Value Range   Color, Urine YELLOW  YELLOW   APPearance HAZY (*) CLEAR   Specific Gravity, Urine >1.030 (*) 1.005 - 1.030   pH 6.0  5.0 - 8.0   Glucose, UA NEGATIVE  NEGATIVE mg/dL   Hgb urine dipstick LARGE (*) NEGATIVE   Bilirubin Urine SMALL (*) NEGATIVE   Ketones, ur TRACE (*) NEGATIVE mg/dL   Protein, ur 30 (*) NEGATIVE mg/dL   Urobilinogen, UA 2.0 (*) 0.0 - 1.0 mg/dL   Nitrite NEGATIVE  NEGATIVE   Leukocytes, UA NEGATIVE  NEGATIVE  PREGNANCY, URINE      Result Value Range   Preg Test, Ur NEGATIVE  NEGATIVE  CBC WITH DIFFERENTIAL      Result Value Range   WBC 5.7  4.0 - 10.5 K/uL   RBC 3.99  3.87 - 5.11 MIL/uL   Hemoglobin 12.1  12.0 - 15.0 g/dL   HCT 16.1 (*) 09.6 - 04.5 %   MCV 85.5  78.0 - 100.0 fL   MCH 30.3  26.0 - 34.0 pg   MCHC 35.5  30.0 - 36.0 g/dL   RDW 40.9  81.1 - 91.4 %   Platelets 209  150 - 400 K/uL   Neutrophils Relative % 70  43 - 77 %   Neutro Abs 4.0  1.7 - 7.7 K/uL   Lymphocytes Relative 20  12 - 46 %   Lymphs Abs 1.1  0.7 - 4.0 K/uL   Monocytes Relative 9  3 - 12 %   Monocytes Absolute 0.5  0.1 - 1.0 K/uL    Eosinophils Relative 1  0 - 5 %  Eosinophils Absolute 0.1  0.0 - 0.7 K/uL   Basophils Relative 0  0 - 1 %   Basophils Absolute 0.0  0.0 - 0.1 K/uL  COMPREHENSIVE METABOLIC PANEL      Result Value Range   Sodium 138  135 - 145 mEq/L   Potassium 3.5  3.5 - 5.1 mEq/L   Chloride 103  96 - 112 mEq/L   CO2 25  19 - 32 mEq/L   Glucose, Bld 90  70 - 99 mg/dL   BUN 12  6 - 23 mg/dL   Creatinine, Ser 1.61  0.50 - 1.10 mg/dL   Calcium 8.8  8.4 - 09.6 mg/dL   Total Protein 6.5  6.0 - 8.3 g/dL   Albumin 3.8  3.5 - 5.2 g/dL   AST 12  0 - 37 U/L   ALT 9  0 - 35 U/L   Alkaline Phosphatase 67  39 - 117 U/L   Total Bilirubin 0.6  0.3 - 1.2 mg/dL   GFR calc non Af Amer >90  >90 mL/min   GFR calc Af Amer >90  >90 mL/min  LIPASE, BLOOD      Result Value Range   Lipase 50  11 - 59 U/L  URINE MICROSCOPIC-ADD ON      Result Value Range   Squamous Epithelial / LPF MANY (*) RARE   WBC, UA 0-2  <3 WBC/hpf   RBC / HPF 21-50  <3 RBC/hpf   Bacteria, UA MANY (*) RARE   Urine-Other AMORPHOUS URATES/PHOSPHATES     No results found.     1. Pelvic pain       MDM  Patient with lower to mid abdominal pain. Has a history of irritable bowel. It began acutely 2 days ago.laboratory reassuring except for red cells and bacteria in the urine. Patient had CT clear ago that did not show ureteral or kidney stones.with the adnexal tenderness it is felt that it could be ovarian cyst. Ovarian torsion felt less likely since it has been going constantly for 2 days. If it is torsion is likely poor prognosis for that over any weight. ultrasound will be done in the morning.appendicitis is felt less likely. With a normal white count I am hesitant to do another CT on a 21 year old female. She will followup tomorrow for the ultrasound. Antibiotics given due to her dysuria. Culture sent. Kidney stone also possible but felt less likely due to scan with no previous stones.    I personally performed the services described in  this documentation, which was scribed in my presence. The recorded information has been reviewed and is accurate.        Juliet Rude. Rubin Payor, MD 02/09/13 0000

## 2013-02-09 ENCOUNTER — Other Ambulatory Visit (HOSPITAL_COMMUNITY): Payer: Self-pay | Admitting: Emergency Medicine

## 2013-02-09 ENCOUNTER — Ambulatory Visit (HOSPITAL_COMMUNITY)
Admit: 2013-02-09 | Discharge: 2013-02-09 | Disposition: A | Discharge status: Home / Self Care | Payer: BC Managed Care – PPO | Source: Ambulatory Visit | Attending: Emergency Medicine | Admitting: Emergency Medicine

## 2013-02-09 DIAGNOSIS — R1032 Left lower quadrant pain: Secondary | ICD-10-CM

## 2013-02-09 DIAGNOSIS — N926 Irregular menstruation, unspecified: Secondary | ICD-10-CM | POA: Insufficient documentation

## 2013-02-09 DIAGNOSIS — R9389 Abnormal findings on diagnostic imaging of other specified body structures: Secondary | ICD-10-CM | POA: Insufficient documentation

## 2013-02-10 LAB — GC/CHLAMYDIA PROBE AMP
CT Probe RNA: NEGATIVE
GC Probe RNA: NEGATIVE

## 2013-07-18 IMAGING — CT CT ABD-PELV W/ CM
2 of 3 series · 16 of 46 positions shown, 18 images · IV contrast (Omnipaque 300)
Comparison: 06/26/2010

CLINICAL DATA: Left-sided abdominal pain.

CT ABDOMEN AND PELVIS WITH CONTRAST
TECHNIQUE: Multidetector CT imaging of the abdomen and pelvis was
performed following the standard protocol during bolus
administration of intravenous contrast.
Contrast: 100mL OMNIPAQUE IOHEXOL 300 MG/ML  SOLN

[Series 3: abd_pel_with 3.0 spo cor · coronal · 0.62mm/px · 3 of 76 slices shown]
[im 26/76  soft-tissue]
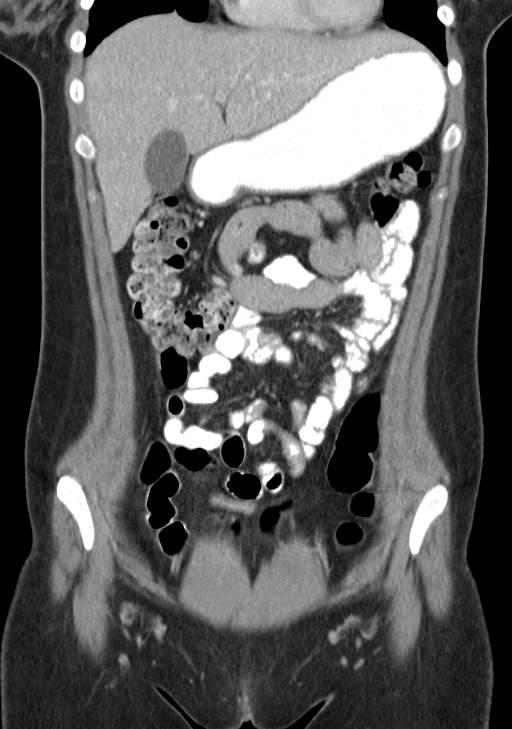
[im 34/76  soft-tissue]
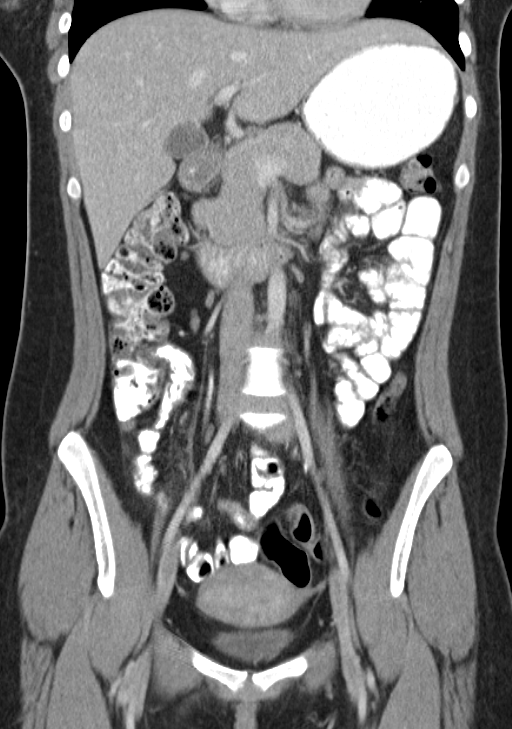
[im 42/76  soft-tissue]
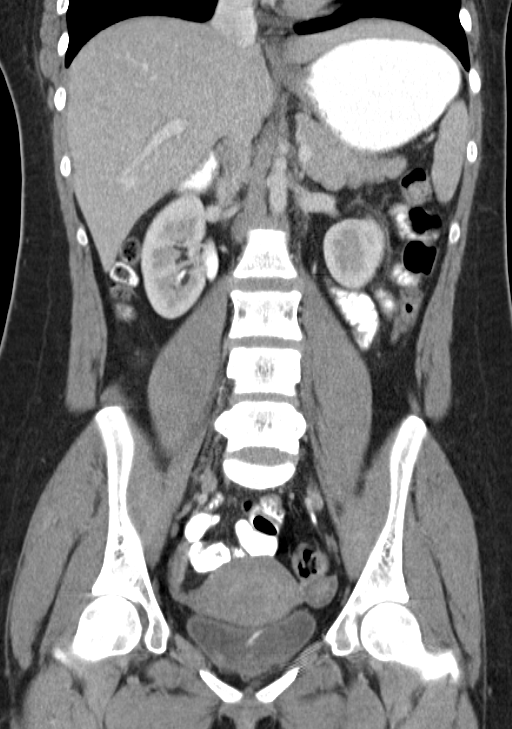

[Series 6: abd_pel_with 5.0 b40f · axial · 0.61mm/px · z∈[-617,-217]mm · 13 of 92 slices shown, 15 images]
[im 6/92  soft-tissue]
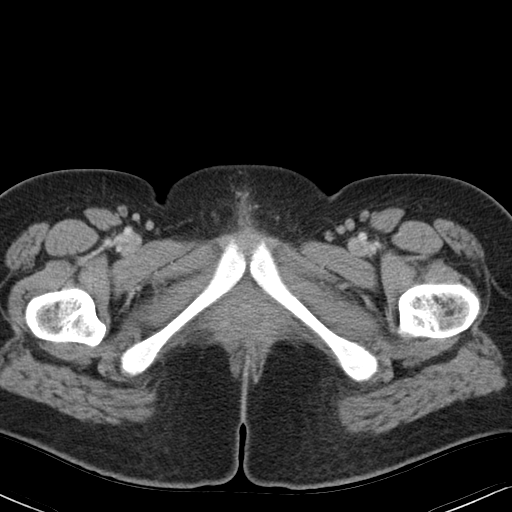
[im 6/92  bone]
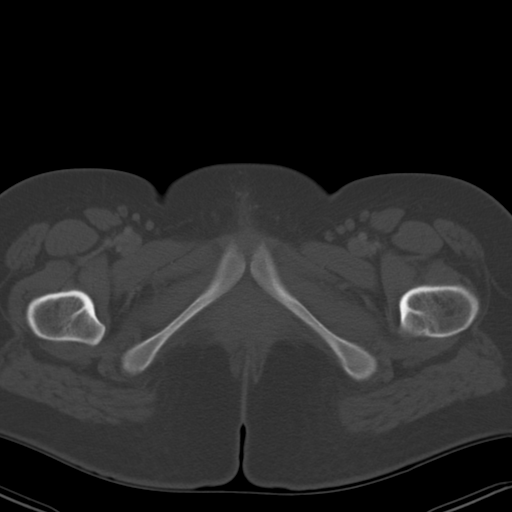
[im 12/92  soft-tissue]
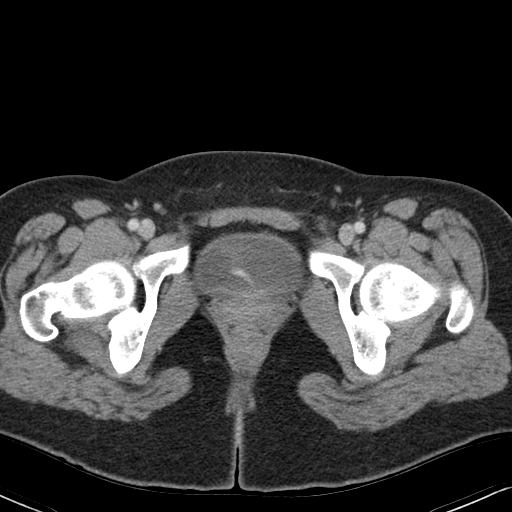
[im 18/92  soft-tissue]
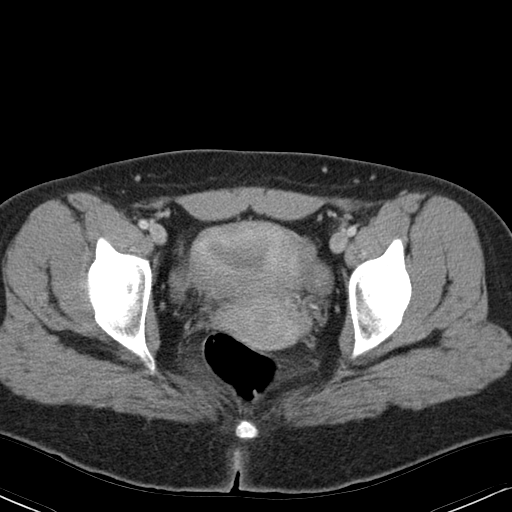
[im 27/92  soft-tissue]
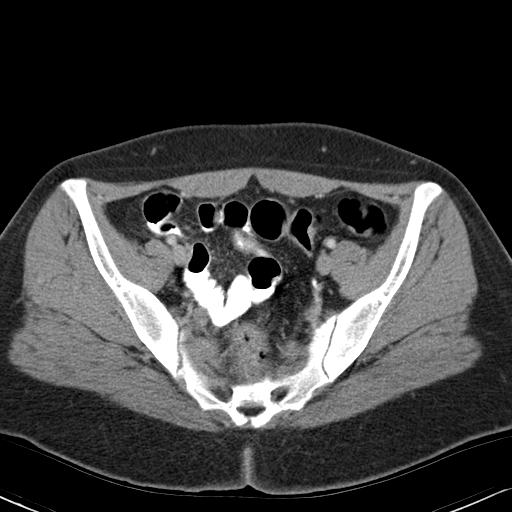
[im 33/92  soft-tissue]
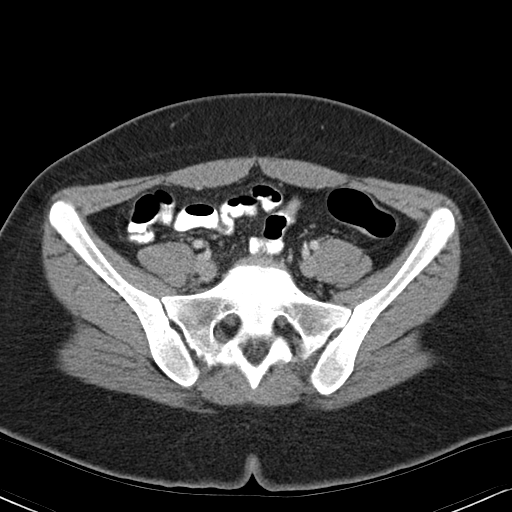
[im 39/92  soft-tissue]
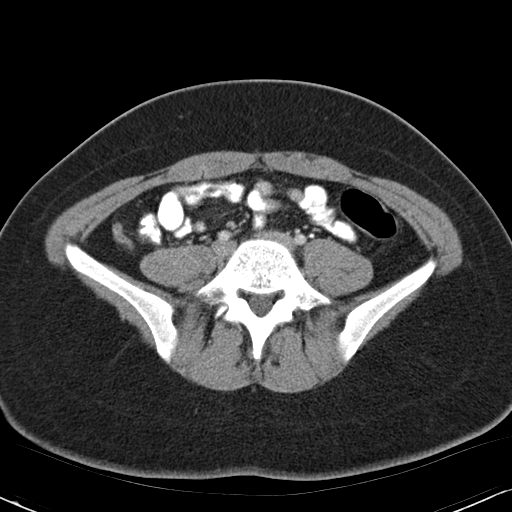
[im 47/92  soft-tissue]
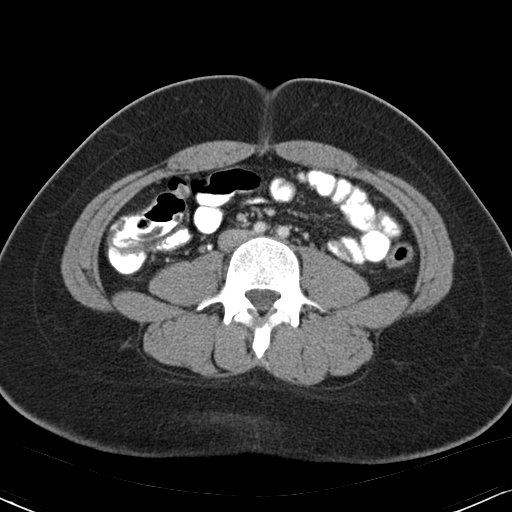
[im 53/92  soft-tissue]
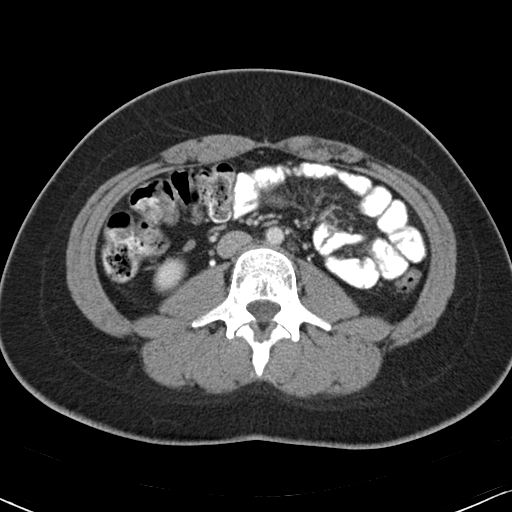
[im 59/92  soft-tissue]
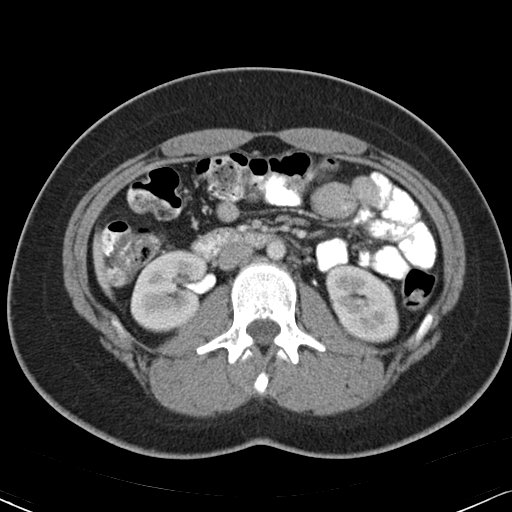
[im 59/92  bone]
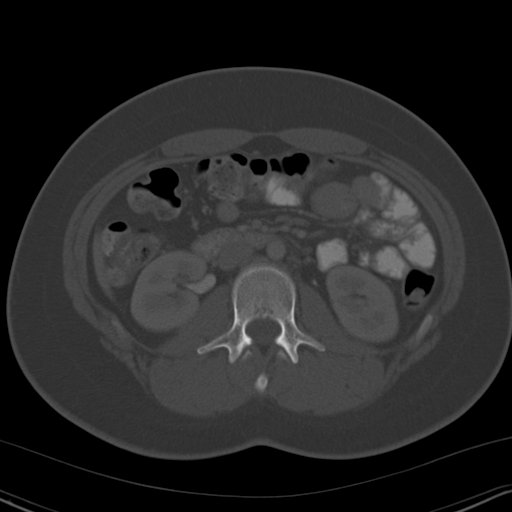
[im 65/92  soft-tissue]
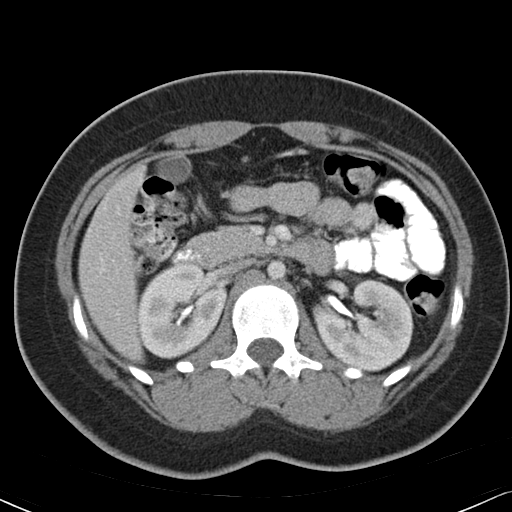
[im 74/92  soft-tissue]
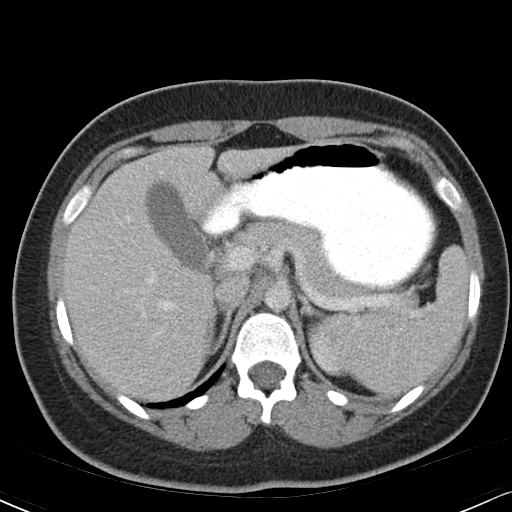
[im 80/92  soft-tissue]
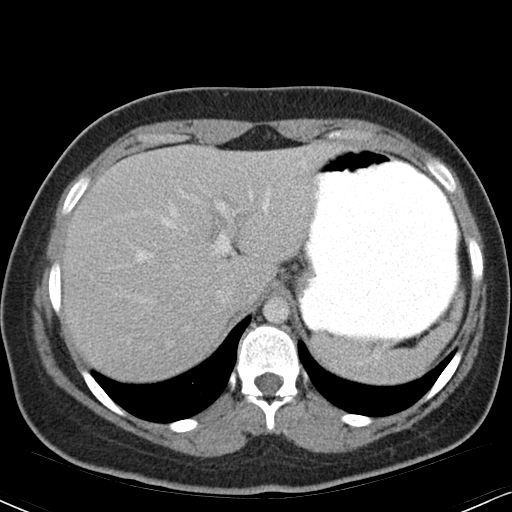
[im 86/92  soft-tissue]
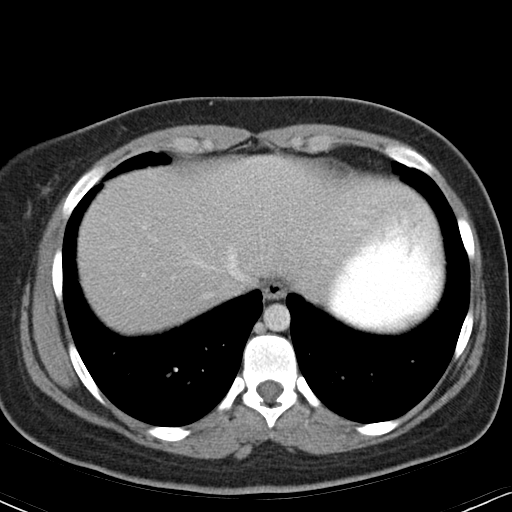

[16 of 46 positions shown; findings below may reference images not displayed]

FINDINGS: Stable linear scarring in the right middle lobe.  Lung
bases otherwise clear.  No pleural effusions.

Liver, gallbladder, spleen, pancreas, adrenals and kidneys are
unremarkable.  Uterus, adnexa and urinary bladder unremarkable.

Appendix is visualized and is normal.  There are mildly prominent
right lower quadrant and central mesenteric lymph nodes, stable
since prior study.  Large and small bowel are unremarkable.  No
free fluid, free air or adenopathy. No acute bony abnormality.
IMPRESSION: Normal appendix.

No acute findings.

## 2016-01-23 ENCOUNTER — Encounter (INDEPENDENT_AMBULATORY_CARE_PROVIDER_SITE_OTHER): Payer: Self-pay | Admitting: Internal Medicine

## 2016-01-23 ENCOUNTER — Encounter (INDEPENDENT_AMBULATORY_CARE_PROVIDER_SITE_OTHER): Payer: Self-pay

## 2016-01-23 ENCOUNTER — Ambulatory Visit (INDEPENDENT_AMBULATORY_CARE_PROVIDER_SITE_OTHER): Payer: Medicaid Other | Admitting: Internal Medicine

## 2016-01-23 VITALS — BP 116/58 | HR 72 | Temp 98.5°F | Ht 61.0 in | Wt 139.7 lb

## 2016-01-23 DIAGNOSIS — R1030 Lower abdominal pain, unspecified: Secondary | ICD-10-CM | POA: Diagnosis not present

## 2016-01-23 NOTE — Progress Notes (Addendum)
   Subjective:    Patient ID: Diane Ford, female    DOB: 1991-05-04, 25 y.o.   MRN: [id number]  HPI [facility] with abdominal pain , nausea since March.  Hx kidney stones.  In the ED she had a fever, cough , poor po intake, and a pain attack due to pain.  No vomiting or diarrhea.or chills US abdomen 01/15/2016 showed a nonobstructing bilateral kidney stone.  She was given meds for nausea and pain.  She was also [facility]. CT  Scan revealeda single small, nonobstructing calculus in each kidney.  She says she has nausea. She is eating once a day. She has lower abdominal pain. She has had the pain since late March.  She thinks she has lost about 5-10 pounds since March.  She has bloating. Rates the lower abdominal pain at a 7 or 8.  Nausea but no vomiting. Small amt of acid reflux.  Usually has a BM every day but sometimes she will skip a day.  Seen in 2014 for nausea. HIDA scan normal. H. Pylori negative.  01/15/2015 H and H 13.3 and 39.3, MCV 87.9. Total bili 0.8, AST 14.1, ALT 6.  ALP 69. Urine few bacterial. CT scan on 12/29/2015 showed single,small non-obstructing calculus in each kidney. No acute abnormality. Last Pelvic exam was 8 months ago.  Review of Systems     Past Medical History  Diagnosis Date  . Seizures (HCC)   . Headache(784.0)   . Epilepsy (HCC)   . Anxiety   . Migraines   . Depression   . IBS (irritable bowel syndrome)     Past Surgical History  Procedure Laterality Date  . Tonsillectomy    . Temperol lobe brain damage    . Tonsillectomy      No Known Allergies  Current Outpatient Prescriptions on File Prior to Visit  Medication Sig Dispense Refill  . ibuprofen (ADVIL,MOTRIN) 200 MG tablet Take 800 mg by mouth every 6 (six) hours as needed for pain, fever or headache. For headache pain    . levETIRAcetam (KEPPRA) 500 MG tablet Take 1,500 mg by mouth every morning.     Marland Kitchen. oxyCODONE-acetaminophen (PERCOCET/ROXICET)  5-325 MG per tablet Take 1-2 tablets by mouth every 6 (six) hours as needed for pain. 8 tablet 0  . peppermint spirit SPRT Take 1 capsule by mouth daily.     No current facility-administered medications on file prior to visit.     Objective:   Physical ExamBlood pressure 116/58, pulse 72, temperature 98.5 F (36.9 C), height 5\' 1"  (1.549 m), weight 139 lb 11.2 oz (63.368 kg). Alert and oriented. Skin warm and dry. Oral mucosa is moist.   . Sclera anicteric, conjunctivae is pink. Thyroid not enlarged. No cervical lymphadenopathy. Lungs clear. Heart regular rate and rhythm.  Abdomen is soft. Bowel sounds are positive. No hepatomegaly. No abdominal masses felt. No tenderness.  No edema to lower extremities.          Assessment & Plan:  Lower abdominal pain ? Etiology. [facility]  Recent hx of kidney stones If pain increases, patient need to go to the ED Urine, CMET today, CBC OV in 3 months. Follow up with GYN.

## 2016-01-23 NOTE — Patient Instructions (Signed)
Labs today

## 2016-01-24 LAB — CBC WITH DIFFERENTIAL/PLATELET
BASOS PCT: 0 %
Basophils Absolute: 0 cells/uL (ref 0–200)
Eosinophils Absolute: 56 cells/uL (ref 15–500)
Eosinophils Relative: 1 %
HEMATOCRIT: 39.1 % (ref 35.0–45.0)
HEMOGLOBIN: 13.1 g/dL (ref 11.7–15.5)
LYMPHS ABS: 1680 {cells}/uL (ref 850–3900)
Lymphocytes Relative: 30 %
MCH: 29.4 pg (ref 27.0–33.0)
MCHC: 33.5 g/dL (ref 32.0–36.0)
MCV: 87.9 fL (ref 80.0–100.0)
MONO ABS: 336 {cells}/uL (ref 200–950)
MPV: 9.7 fL (ref 7.5–12.5)
Monocytes Relative: 6 %
NEUTROS ABS: 3528 {cells}/uL (ref 1500–7800)
Neutrophils Relative %: 63 %
Platelets: 261 10*3/uL (ref 140–400)
RBC: 4.45 MIL/uL (ref 3.80–5.10)
RDW: 14.4 % (ref 11.0–15.0)
WBC: 5.6 10*3/uL (ref 3.8–10.8)

## 2016-01-24 LAB — URINALYSIS
Bilirubin Urine: NEGATIVE
Glucose, UA: NEGATIVE
HGB URINE DIPSTICK: NEGATIVE
LEUKOCYTES UA: NEGATIVE
NITRITE: NEGATIVE
PH: 7 (ref 5.0–8.0)
Protein, ur: NEGATIVE
SPECIFIC GRAVITY, URINE: 1.028 (ref 1.001–1.035)

## 2016-01-24 LAB — COMPREHENSIVE METABOLIC PANEL
ALBUMIN: 4.3 g/dL (ref 3.6–5.1)
ALK PHOS: 63 U/L (ref 33–115)
ALT: 6 U/L (ref 6–29)
AST: 12 U/L (ref 10–30)
BILIRUBIN TOTAL: 0.6 mg/dL (ref 0.2–1.2)
BUN: 11 mg/dL (ref 7–25)
CALCIUM: 9 mg/dL (ref 8.6–10.2)
CO2: 23 mmol/L (ref 20–31)
CREATININE: 0.71 mg/dL (ref 0.50–1.10)
Chloride: 103 mmol/L (ref 98–110)
GLUCOSE: 90 mg/dL (ref 65–99)
Potassium: 3.8 mmol/L (ref 3.5–5.3)
SODIUM: 138 mmol/L (ref 135–146)
Total Protein: 6.7 g/dL (ref 6.1–8.1)

## 2016-01-30 NOTE — Progress Notes (Signed)
Called patient, left her a message - scheduled her an appointment for 03/05/16 at 11:00am

## 2016-03-05 ENCOUNTER — Ambulatory Visit (INDEPENDENT_AMBULATORY_CARE_PROVIDER_SITE_OTHER): Payer: Medicaid Other | Admitting: Internal Medicine

## 2016-03-05 ENCOUNTER — Encounter (INDEPENDENT_AMBULATORY_CARE_PROVIDER_SITE_OTHER): Payer: Self-pay | Admitting: Internal Medicine

## 2016-03-05 VITALS — BP 98/60 | HR 72 | Temp 99.2°F | Ht 61.0 in | Wt 133.7 lb

## 2016-03-05 DIAGNOSIS — R110 Nausea: Secondary | ICD-10-CM

## 2016-03-05 LAB — CBC WITH DIFFERENTIAL/PLATELET
Basophils Absolute: 0 cells/uL (ref 0–200)
Basophils Relative: 0 %
EOS ABS: 49 {cells}/uL (ref 15–500)
Eosinophils Relative: 1 %
HEMATOCRIT: 40 % (ref 35.0–45.0)
Hemoglobin: 13.3 g/dL (ref 11.7–15.5)
LYMPHS PCT: 32 %
Lymphs Abs: 1568 cells/uL (ref 850–3900)
MCH: 29.6 pg (ref 27.0–33.0)
MCHC: 33.3 g/dL (ref 32.0–36.0)
MCV: 88.9 fL (ref 80.0–100.0)
MONO ABS: 392 {cells}/uL (ref 200–950)
MPV: 10.3 fL (ref 7.5–12.5)
Monocytes Relative: 8 %
NEUTROS PCT: 59 %
Neutro Abs: 2891 cells/uL (ref 1500–7800)
Platelets: 284 10*3/uL (ref 140–400)
RBC: 4.5 MIL/uL (ref 3.80–5.10)
RDW: 14 % (ref 11.0–15.0)
WBC: 4.9 10*3/uL (ref 3.8–10.8)

## 2016-03-05 MED ORDER — DICYCLOMINE HCL 10 MG PO CAPS: 10.0000 mg | ORAL_CAPSULE | Freq: Every day | ORAL | Status: DC

## 2016-03-05 NOTE — Patient Instructions (Addendum)
OV in 6 months. CBC and Hepatic today

## 2016-03-05 NOTE — Progress Notes (Signed)
   Subjective:    Patient ID: Diane Ford, female    DOB: 1991-02-15, 25 y.o.   MRN: [id number]  HPI Here today for f/u. She was last seen in May with abdominal pain. She also c/o nausea.  If she has abdominal pain, it is located around her umblical area. She has this about 1-3 times a week. She has nausea 1-3 times a week.  She has had the nausea and stomach problems since she was in her teens.  She underwent an US 01/15/2016 which showed a non-obstructing bilateral kidney stone.    Her last GYN exam was 6 months ago and was nurmal. She tells me she is doing better. Her appetite is good. She has lost 5 pounds since her last visit. If she has nausea, she says she will not eat, and then she will overeat if she is not eating. She usually has a BM once every other day. She does not have acid reflux.   01/14/2016 US abdomen: [facility]: Non obstructing small bilateral renal calculi. No gallstones.   Seen in 2014 for nausea. HIDA scan normal. H. Pylori negative.  01/15/2015 H and H 13.3 and 39.3, MCV 87.9. Total bili 0.8, AST 14.1, ALT 6. ALP 69. Urine few bacterial. CT scan on 12/29/2015 showed single,small non-obstructing calculus in each kidney. No acute abnormality.        Review of Systems Past Medical History  Diagnosis Date  . Seizures (HCC)   . Headache(784.0)   . Epilepsy (HCC)   . Anxiety   . Migraines   . Depression   . IBS (irritable bowel syndrome)     Past Surgical History  Procedure Laterality Date  . Tonsillectomy    . Temperol lobe brain damage    . Tonsillectomy      No Known Allergies  Current Outpatient Prescriptions on File Prior to Visit  Medication Sig Dispense Refill  . ibuprofen (ADVIL,MOTRIN) 200 MG tablet Take 800 mg by mouth every 6 (six) hours as needed for pain, fever or headache. For headache pain    . levETIRAcetam (KEPPRA) 500 MG tablet Take 1,500 mg by mouth every morning.     . ondansetron (ZOFRAN) 4 MG tablet Take 4 mg by mouth every 8 (eight)  hours as needed for nausea or vomiting.    . peppermint spirit SPRT Take 1 capsule by mouth daily.    Marland Kitchen. etodolac (LODINE) 200 MG capsule Take by mouth as needed. Reported on 03/05/2016    . oxyCODONE-acetaminophen (PERCOCET/ROXICET) 5-325 MG per tablet Take 1-2 tablets by mouth every 6 (six) hours as needed for pain. (Patient not taking: Reported on 03/05/2016) 8 tablet 0   No current facility-administered medications on file prior to visit.        Objective:   Physical Exam Blood pressure 98/60, pulse 72, temperature 99.2 F (37.3 C), height 5\' 1"  (1.549 m), weight 133 lb 11.2 oz (60.646 kg). Alert and oriented. Skin warm and dry. Oral mucosa is moist.   . Sclera anicteric, conjunctivae is pink. Thyroid not enlarged. No cervical lymphadenopathy. Lungs clear. Heart regular rate and rhythm.  Abdomen is soft. Bowel sounds are positive. No hepatomegaly. No abdominal masses felt. Very slight tenderness left mid abdomen.  No edema to lower extremities.         Assessment & Plan:  Chronic nausea. Zofran as needed. Occasionally abdominal pain.  Rx for Dicyclomine once a day. CBC and Hepatic function today.

## 2016-03-06 LAB — HEPATIC FUNCTION PANEL
ALBUMIN: 4.1 g/dL (ref 3.6–5.1)
ALT: 5 U/L — AB (ref 6–29)
AST: 11 U/L (ref 10–30)
Alkaline Phosphatase: 57 U/L (ref 33–115)
Bilirubin, Direct: 0.1 mg/dL (ref <=0.2)
Indirect Bilirubin: 0.4 mg/dL (ref 0.2–1.2)
TOTAL PROTEIN: 6.6 g/dL (ref 6.1–8.1)
Total Bilirubin: 0.5 mg/dL (ref 0.2–1.2)

## 2016-03-10 ENCOUNTER — Telehealth (INDEPENDENT_AMBULATORY_CARE_PROVIDER_SITE_OTHER): Payer: Self-pay | Admitting: Internal Medicine

## 2016-03-10 NOTE — Telephone Encounter (Signed)
Patient's mom, Chris calledThayer Ohm for results.  [phone]

## 2016-03-11 NOTE — Telephone Encounter (Signed)
I have given results to patient.

## 2016-03-11 NOTE — Telephone Encounter (Signed)
No answer at home#. Will try again tomorrow.

## 2016-06-09 ENCOUNTER — Other Ambulatory Visit: Payer: Self-pay | Admitting: Physician Assistant

## 2016-09-04 ENCOUNTER — Ambulatory Visit (INDEPENDENT_AMBULATORY_CARE_PROVIDER_SITE_OTHER): Payer: Medicaid Other | Admitting: Internal Medicine

## 2016-09-04 ENCOUNTER — Encounter (INDEPENDENT_AMBULATORY_CARE_PROVIDER_SITE_OTHER): Payer: Self-pay

## 2016-09-04 ENCOUNTER — Encounter (INDEPENDENT_AMBULATORY_CARE_PROVIDER_SITE_OTHER): Payer: Self-pay | Admitting: Internal Medicine

## 2016-09-04 VITALS — BP 130/80 | HR 84 | Temp 98.1°F | Ht 61.0 in | Wt 133.1 lb

## 2016-09-04 DIAGNOSIS — R110 Nausea: Secondary | ICD-10-CM | POA: Diagnosis not present

## 2016-09-04 NOTE — Progress Notes (Signed)
   Subjective:    Patient ID: Diane Ford, female    DOB: [birth year], 25 y.o.   MRN: [id number]  HPIHere today for f/u. She was last seen in June. She was doing well.  [facility]. She tells me she is doing good. There is no abdominal pain. Occasionally has some nausea, but is much better. She has a BM about once a day.  Seen in the ED in November for Kidney stone.  She sees Dr. Tobie Lordsimothy Mullins.     Review of Systems Past Medical History:  Diagnosis Date  . Anxiety   . Depression   . Epilepsy (HCC)   . Headache(784.0)   . IBS (irritable bowel syndrome)   . Migraines   . Seizures (HCC)     Past Surgical History:  Procedure Laterality Date  . temperol lobe brain damage    . TONSILLECTOMY    . TONSILLECTOMY      No Known Allergies  Current Outpatient Prescriptions on File Prior to Visit  Medication Sig Dispense Refill  . dicyclomine (BENTYL) 10 MG capsule Take 1 capsule (10 mg total) by mouth daily. 90 capsule 2  . etodolac (LODINE) 200 MG capsule Take by mouth as needed. Reported on 03/05/2016    . ibuprofen (ADVIL,MOTRIN) 200 MG tablet Take 800 mg by mouth every 6 (six) hours as needed for pain, fever or headache. For headache pain    . levETIRAcetam (KEPPRA) 500 MG tablet Take 1,500 mg by mouth every morning.     . peppermint spirit SPRT Take 1 capsule by mouth daily.     No current facility-administered medications on file prior to visit.        Objective:   Physical Exam Blood pressure 130/80, pulse 84, temperature 98.1 F (36.7 C), height 5\' 1"  (1.549 m), weight 133 lb 1.6 oz (60.4 kg).  Alert and oriented. Skin warm and dry. Oral mucosa is moist.   . Sclera anicteric, conjunctivae is pink. Thyroid not enlarged. No cervical lymphadenopathy. Lungs clear. Heart regular rate and rhythm.  Abdomen is soft. Bowel sounds are positive. No hepatomegaly. No abdominal masses felt. No tenderness.  No edema to lower extremities.  .      Assessment & Plan:   Nausea for the most part has resolved. Possible IBS. Takes Dicyclmine and she is much better. She will have OV in 1 year.

## 2016-09-04 NOTE — Patient Instructions (Signed)
Continue the Dicyclomine. OV in 1 year. 

## 2017-04-15 ENCOUNTER — Telehealth (INDEPENDENT_AMBULATORY_CARE_PROVIDER_SITE_OTHER): Payer: Self-pay | Admitting: Internal Medicine

## 2017-04-15 DIAGNOSIS — R110 Nausea: Secondary | ICD-10-CM

## 2017-04-15 MED ORDER — DICYCLOMINE HCL 10 MG PO CAPS: 10.0000 mg | ORAL_CAPSULE | Freq: Every day | ORAL | 3 refills | Status: DC

## 2017-04-15 NOTE — Telephone Encounter (Signed)
Rx sent 

## 2017-08-03 ENCOUNTER — Encounter (INDEPENDENT_AMBULATORY_CARE_PROVIDER_SITE_OTHER): Payer: Self-pay | Admitting: Internal Medicine

## 2017-09-09 ENCOUNTER — Ambulatory Visit (INDEPENDENT_AMBULATORY_CARE_PROVIDER_SITE_OTHER): Payer: Medicaid Other | Admitting: Internal Medicine

## 2017-09-09 ENCOUNTER — Encounter (INDEPENDENT_AMBULATORY_CARE_PROVIDER_SITE_OTHER): Payer: Self-pay | Admitting: Internal Medicine

## 2017-09-09 ENCOUNTER — Encounter (INDEPENDENT_AMBULATORY_CARE_PROVIDER_SITE_OTHER): Payer: Self-pay

## 2017-09-09 VITALS — BP 130/80 | HR 64 | Temp 98.3°F | Ht 61.0 in | Wt 134.0 lb

## 2017-09-09 DIAGNOSIS — K588 Other irritable bowel syndrome: Secondary | ICD-10-CM | POA: Diagnosis not present

## 2017-09-09 NOTE — Patient Instructions (Signed)
OV in 1 year.Continue the Dicyclomine.  

## 2017-09-09 NOTE — Progress Notes (Signed)
   Subjective:    Patient ID: Diane Ford, female    DOB: [birth year], 26 y.o.   MRN: [id number]  HPI Her today for f/u. Last seen in December of 2017. Hx of IBS. Dicyclomine is controlling her IBS. She has a BM x 1 a day. Stools are nice and formed. Appetite is good. She has maintained her weight.  She is doing well.  She walks frequently,.    Review of Systems Past Medical History:  Diagnosis Date  . Anxiety   . Depression   . Epilepsy (HCC)   . Headache(784.0)   . IBS (irritable bowel syndrome)   . Migraines   . Seizures (HCC)     Past Surgical History:  Procedure Laterality Date  . temperol lobe brain damage    . TONSILLECTOMY    . TONSILLECTOMY      No Known Allergies  Current Outpatient Medications on File Prior to Visit  Medication Sig Dispense Refill  . dicyclomine (BENTYL) 10 MG capsule Take 1 capsule (10 mg total) by mouth daily. 90 capsule 3  . etodolac (LODINE) 200 MG capsule Take by mouth as needed. Reported on 03/05/2016    . ibuprofen (ADVIL,MOTRIN) 200 MG tablet Take 800 mg by mouth every 6 (six) hours as needed for pain, fever or headache. For headache pain    . levETIRAcetam (KEPPRA) 500 MG tablet Take 1,500 mg by mouth every morning.     . peppermint spirit SPRT Take 1 capsule by mouth daily.     No current facility-administered medications on file prior to visit.         Objective:   Physical Exam Blood pressure 130/80, pulse 64, temperature 98.3 F (36.8 C), height 5\' 1"  (1.549 m), weight 134 lb (60.8 kg). Alert and oriented. Skin warm and dry. Oral mucosa is moist.   . Sclera anicteric, conjunctivae is pink. Thyroid not enlarged. No cervical lymphadenopathy. Lungs clear. Heart regular rate and rhythm.  Abdomen is soft. Bowel sounds are positive. No hepatomegaly. No abdominal masses felt. No tenderness.  No edema to lower extremities.          Assessment & Plan:  IBS. She is doing well.  Symptoms controlled with Dicyclomine. OV in 1  year.

## 2018-06-12 ENCOUNTER — Other Ambulatory Visit (INDEPENDENT_AMBULATORY_CARE_PROVIDER_SITE_OTHER): Payer: Self-pay | Admitting: Internal Medicine

## 2018-06-12 DIAGNOSIS — R110 Nausea: Secondary | ICD-10-CM

## 2018-09-09 ENCOUNTER — Encounter (INDEPENDENT_AMBULATORY_CARE_PROVIDER_SITE_OTHER): Payer: Self-pay | Admitting: Internal Medicine

## 2018-09-09 ENCOUNTER — Ambulatory Visit (INDEPENDENT_AMBULATORY_CARE_PROVIDER_SITE_OTHER): Payer: Self-pay | Admitting: Internal Medicine

## 2018-09-23 ENCOUNTER — Ambulatory Visit (INDEPENDENT_AMBULATORY_CARE_PROVIDER_SITE_OTHER): Payer: Self-pay | Admitting: Internal Medicine

## 2018-10-13 ENCOUNTER — Ambulatory Visit (INDEPENDENT_AMBULATORY_CARE_PROVIDER_SITE_OTHER): Payer: Medicaid Other | Admitting: Internal Medicine

## 2018-10-13 ENCOUNTER — Encounter (INDEPENDENT_AMBULATORY_CARE_PROVIDER_SITE_OTHER): Payer: Self-pay | Admitting: Internal Medicine

## 2018-10-13 DIAGNOSIS — K588 Other irritable bowel syndrome: Secondary | ICD-10-CM | POA: Diagnosis not present

## 2018-10-13 DIAGNOSIS — K589 Irritable bowel syndrome without diarrhea: Secondary | ICD-10-CM | POA: Insufficient documentation

## 2018-10-13 NOTE — Progress Notes (Signed)
   Subjective:    Patient ID: Diane Ford, female    DOB: 09-16-1990, 28 y.o.   MRN: [id number]  HPI Here today for f/u. Last seen in December of 2018. Hx of IBS and  maintained on Dicyclomine.She tells me she is doing good. She is having a BM daily. Occasionally has some abdominal cramping with her BMs. Appetite is good. She hs gained from 134 to 152.5 since starting the Zoloft in April of last year. No change in her menstrual cycle.  She tries to exercise a few times a week.   Review of Systems Past Medical History:  Diagnosis Date  . Anxiety   . Depression   . Epilepsy (HCC)   . Headache(784.0)   . IBS (irritable bowel syndrome)   . Migraines   . Seizures (HCC)     Past Surgical History:  Procedure Laterality Date  . temperol lobe brain damage    . TONSILLECTOMY    . TONSILLECTOMY      No Known Allergies  Current Outpatient Medications on File Prior to Visit  Medication Sig Dispense Refill  . dicyclomine (BENTYL) 10 MG capsule TAKE 1 CAPSULE BY MOUTH EVERY DAY 90 capsule 3  . ibuprofen (ADVIL,MOTRIN) 200 MG tablet Take 800 mg by mouth every 6 (six) hours as needed for pain, fever or headache. For headache pain    . levETIRAcetam (KEPPRA) 500 MG tablet Take 1,500 mg by mouth every morning.     . sertraline (ZOLOFT) 50 MG tablet Take 50 mg by mouth daily.     No current facility-administered medications on file prior to visit.         Objective:   Physical Exam Blood pressure (!) 135/93, pulse 84, temperature 98.1 F (36.7 C), height 5\' 1"  (1.549 m), weight 152 lb 8 oz (69.2 kg). Alert and oriented. Skin warm and dry. Oral mucosa is moist.   . Sclera anicteric, conjunctivae is pink. Thyroid not enlarged. No cervical lymphadenopathy. Lungs clear. Heart regular rate and rhythm.  Abdomen is soft. Bowel sounds are positive. No hepatomegaly. No abdominal masses felt. No tenderness.  No edema to lower extremities.         Assessment & Plan:  IBS. She will continue  the Dicyclomine. She is doing well.  She will have OV in 1 year.

## 2018-10-13 NOTE — Patient Instructions (Signed)
OV in 1 year. Continue the dicyclomine.

## 2019-10-13 ENCOUNTER — Ambulatory Visit (INDEPENDENT_AMBULATORY_CARE_PROVIDER_SITE_OTHER): Payer: Medicaid Other | Admitting: Gastroenterology
